# Patient Record
Sex: Male | Born: 1995 | Hispanic: No | Marital: Single | Smoking: Never smoker
Health system: Southern US, Community
[De-identification: ages and names within clinical notes are randomized; demographics above are authoritative.]

---

## 2016-07-18 ENCOUNTER — Emergency Department
Admission: EM | Admit: 2016-07-18 | Discharge: 2016-07-18 | Disposition: A | Discharge status: Home / Self Care | Payer: No Typology Code available for payment source | Attending: Emergency Medicine | Admitting: Emergency Medicine

## 2016-07-18 ENCOUNTER — Emergency Department: Payer: No Typology Code available for payment source

## 2016-07-18 DIAGNOSIS — Y999 Unspecified external cause status: Secondary | ICD-10-CM | POA: Diagnosis not present

## 2016-07-18 DIAGNOSIS — S299XXA Unspecified injury of thorax, initial encounter: Secondary | ICD-10-CM | POA: Diagnosis present

## 2016-07-18 DIAGNOSIS — S20219A Contusion of unspecified front wall of thorax, initial encounter: Secondary | ICD-10-CM | POA: Diagnosis not present

## 2016-07-18 DIAGNOSIS — Y92410 Unspecified street and highway as the place of occurrence of the external cause: Secondary | ICD-10-CM | POA: Diagnosis not present

## 2016-07-18 DIAGNOSIS — Y9389 Activity, other specified: Secondary | ICD-10-CM | POA: Diagnosis not present
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
## 2016-07-18 NOTE — Discharge Instructions (Signed)
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
